# Patient Record
Sex: Male | Born: 2000 | Race: White | Hispanic: No | Marital: Single | Smoking: Never smoker
Health system: Southern US, Community
[De-identification: ages and names within clinical notes are randomized; demographics above are authoritative.]

## PROBLEM LIST (undated history)

## (undated) DIAGNOSIS — R569 Unspecified convulsions: Secondary | ICD-10-CM

## (undated) DIAGNOSIS — F909 Attention-deficit hyperactivity disorder, unspecified type: Secondary | ICD-10-CM

## (undated) HISTORY — DX: Attention-deficit hyperactivity disorder, unspecified type: F90.9

## (undated) HISTORY — PX: CIRCUMCISION: SUR203

---
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
## 2019-04-10 ENCOUNTER — Emergency Department (HOSPITAL_COMMUNITY)
Admission: EM | Admit: 2019-04-10 | Discharge: 2019-04-10 | Disposition: A | Discharge status: Home / Self Care | Payer: Medicaid Other | Attending: Emergency Medicine | Admitting: Emergency Medicine

## 2019-04-10 ENCOUNTER — Emergency Department (HOSPITAL_COMMUNITY): Payer: Medicaid Other

## 2019-04-10 ENCOUNTER — Encounter (HOSPITAL_COMMUNITY): Payer: Self-pay | Admitting: Emergency Medicine

## 2019-04-10 ENCOUNTER — Other Ambulatory Visit: Payer: Self-pay

## 2019-04-10 DIAGNOSIS — Z7722 Contact with and (suspected) exposure to environmental tobacco smoke (acute) (chronic): Secondary | ICD-10-CM | POA: Diagnosis not present

## 2019-04-10 DIAGNOSIS — Y999 Unspecified external cause status: Secondary | ICD-10-CM | POA: Insufficient documentation

## 2019-04-10 DIAGNOSIS — S161XXA Strain of muscle, fascia and tendon at neck level, initial encounter: Secondary | ICD-10-CM | POA: Diagnosis not present

## 2019-04-10 DIAGNOSIS — Y939 Activity, unspecified: Secondary | ICD-10-CM | POA: Diagnosis not present

## 2019-04-10 DIAGNOSIS — Y929 Unspecified place or not applicable: Secondary | ICD-10-CM | POA: Diagnosis not present

## 2019-04-10 DIAGNOSIS — M542 Cervicalgia: Secondary | ICD-10-CM | POA: Diagnosis not present

## 2019-04-10 DIAGNOSIS — R519 Headache, unspecified: Secondary | ICD-10-CM | POA: Diagnosis not present

## 2019-04-10 MED ORDER — CYCLOBENZAPRINE HCL 10 MG PO TABS: 10.0000 mg | ORAL_TABLET | Freq: Two times a day (BID) | ORAL | 0 refills | Status: DC | PRN

## 2019-04-10 NOTE — Discharge Instructions (Signed)
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
## 2019-04-10 NOTE — ED Notes (Signed)
Patient verbalizes understanding of discharge instructions. Opportunity for questioning and answers were provided. Armband removed by staff, pt discharged from ED.  
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
## 2023-05-04 ENCOUNTER — Other Ambulatory Visit: Payer: Self-pay

## 2023-05-04 ENCOUNTER — Emergency Department (HOSPITAL_COMMUNITY): Payer: BC Managed Care – PPO

## 2023-05-04 ENCOUNTER — Emergency Department (HOSPITAL_COMMUNITY)
Admission: EM | Admit: 2023-05-04 | Discharge: 2023-05-05 | Disposition: A | Discharge status: Home / Self Care | Payer: BC Managed Care – PPO | Source: Home / Self Care | Attending: Emergency Medicine | Admitting: Emergency Medicine

## 2023-05-04 ENCOUNTER — Emergency Department (HOSPITAL_COMMUNITY)
Admission: EM | Admit: 2023-05-04 | Discharge: 2023-05-04 | Disposition: A | Discharge status: Home / Self Care | Payer: BC Managed Care – PPO | Attending: Emergency Medicine | Admitting: Emergency Medicine

## 2023-05-04 ENCOUNTER — Encounter (HOSPITAL_COMMUNITY): Payer: Self-pay

## 2023-05-04 DIAGNOSIS — R1032 Left lower quadrant pain: Secondary | ICD-10-CM | POA: Insufficient documentation

## 2023-05-04 DIAGNOSIS — R319 Hematuria, unspecified: Secondary | ICD-10-CM | POA: Insufficient documentation

## 2023-05-04 DIAGNOSIS — R300 Dysuria: Secondary | ICD-10-CM | POA: Insufficient documentation

## 2023-05-04 DIAGNOSIS — R109 Unspecified abdominal pain: Secondary | ICD-10-CM | POA: Diagnosis present

## 2023-05-04 LAB — URINALYSIS, ROUTINE W REFLEX MICROSCOPIC
Bacteria, UA: NONE SEEN
Bilirubin Urine: NEGATIVE
Bilirubin Urine: NEGATIVE
Glucose, UA: NEGATIVE mg/dL
Glucose, UA: NEGATIVE mg/dL
Ketones, ur: NEGATIVE mg/dL
Ketones, ur: NEGATIVE mg/dL
Leukocytes,Ua: NEGATIVE
Leukocytes,Ua: NEGATIVE
Nitrite: NEGATIVE
Nitrite: NEGATIVE
Protein, ur: NEGATIVE mg/dL
Protein, ur: NEGATIVE mg/dL
RBC / HPF: >50 RBC/hpf (ref 0–5)
Specific Gravity, Urine: 1.023 (ref 1.005–1.030)
Specific Gravity, Urine: 1.02 (ref 1.005–1.030)
pH: 5 (ref 5.0–8.0)
pH: 6 (ref 5.0–8.0)

## 2023-05-04 LAB — BASIC METABOLIC PANEL
Anion gap: 13 (ref 5–15)
Anion gap: 9 (ref 5–15)
BUN: 10 mg/dL (ref 6–20)
BUN: 11 mg/dL (ref 6–20)
CO2: 25 mmol/L (ref 22–32)
CO2: 24 mmol/L (ref 22–32)
Calcium: 9.6 mg/dL (ref 8.9–10.3)
Calcium: 9 mg/dL (ref 8.9–10.3)
Chloride: 103 mmol/L (ref 98–111)
Chloride: 105 mmol/L (ref 98–111)
Creatinine, Ser: 1.15 mg/dL (ref 0.61–1.24)
Creatinine, Ser: 1.35 mg/dL — ABNORMAL HIGH (ref 0.61–1.24)
GFR, Estimated: >60 mL/min (ref >60)
GFR, Estimated: >60 mL/min (ref >60)
Glucose, Bld: 111 mg/dL — ABNORMAL HIGH (ref 70–99)
Glucose, Bld: 111 mg/dL — ABNORMAL HIGH (ref 70–99)
Potassium: 3.7 mmol/L (ref 3.5–5.1)
Potassium: 3.6 mmol/L (ref 3.5–5.1)
Sodium: 141 mmol/L (ref 135–145)
Sodium: 138 mmol/L (ref 135–145)

## 2023-05-04 LAB — CBC
HCT: 41.8 % (ref 39.0–52.0)
Hemoglobin: 14.4 g/dL (ref 13.0–17.0)
MCH: 29.4 pg (ref 26.0–34.0)
MCHC: 34.4 g/dL (ref 30.0–36.0)
MCV: 85.3 fL (ref 80.0–100.0)
Platelets: 264 10*3/uL (ref 150–400)
RBC: 4.9 MIL/uL (ref 4.22–5.81)
RDW: 12.4 % (ref 11.5–15.5)
WBC: 11 10*3/uL — ABNORMAL HIGH (ref 4.0–10.5)
nRBC: 0 % (ref 0.0–0.2)

## 2023-05-04 LAB — CBC WITH DIFFERENTIAL/PLATELET
Abs Immature Granulocytes: 0.26 10*3/uL — ABNORMAL HIGH (ref 0.00–0.07)
Basophils Absolute: 0.1 10*3/uL (ref 0.0–0.1)
Basophils Relative: 1 %
Eosinophils Absolute: 0.1 10*3/uL (ref 0.0–0.5)
Eosinophils Relative: 1 %
HCT: 40.7 % (ref 39.0–52.0)
Hemoglobin: 14 g/dL (ref 13.0–17.0)
Immature Granulocytes: 3 %
Lymphocytes Relative: 14 %
Lymphs Abs: 1.4 10*3/uL (ref 0.7–4.0)
MCH: 28.9 pg (ref 26.0–34.0)
MCHC: 34.4 g/dL (ref 30.0–36.0)
MCV: 84.1 fL (ref 80.0–100.0)
Monocytes Absolute: 0.6 10*3/uL (ref 0.1–1.0)
Monocytes Relative: 5 %
Neutro Abs: 8.2 10*3/uL — ABNORMAL HIGH (ref 1.7–7.7)
Neutrophils Relative %: 76 %
Platelets: 279 10*3/uL (ref 150–400)
RBC: 4.84 MIL/uL (ref 4.22–5.81)
RDW: 12.3 % (ref 11.5–15.5)
WBC: 10.6 10*3/uL — ABNORMAL HIGH (ref 4.0–10.5)
nRBC: 0 % (ref 0.0–0.2)

## 2023-05-04 MED ORDER — NAPROXEN 375 MG PO TABS: 375.0000 mg | ORAL_TABLET | Freq: Two times a day (BID) | ORAL | 0 refills | Status: DC

## 2023-05-04 MED ORDER — OXYCODONE-ACETAMINOPHEN 5-325 MG PO TABS
1.0000 | ORAL_TABLET | ORAL | Status: DC | PRN
  Administered 2023-05-04: 1 via ORAL
  Filled 2023-05-04: qty 1

## 2023-05-04 MED ORDER — ONDANSETRON 4 MG PO TBDP
4.0000 mg | ORAL_TABLET | Freq: Once | ORAL | Status: AC
  Administered 2023-05-04: 4 mg via ORAL
  Filled 2023-05-04: qty 1

## 2023-05-04 NOTE — Discharge Instructions (Addendum)
 Thank you for letting us  evaluate you today.  Your CT kidney stone study was negative for any acute stone.  However, there is some mild fluid in the kidney that may have resulted from a previous stone.  Your urine also showed blood in it which could have been likely due to a previous stone.  We have sent off a urine culture and we will call you if your urine grows bacteria in order to put you on an antibiotic.  I have also sent you naproxen  which is a strong ibuprofen  if you need it for pain.  If you take naproxen , do not use ibuprofen , Aleve , aspirin with it.  Return to emergency department if you experience significant worsening of pain, burning when you urinate, difficulty urinating, urinary retention.  Otherwise, you can follow-up with the PCP recommended above if symptoms do not improve within the next week

## 2023-05-04 NOTE — ED Triage Notes (Signed)
 Pt reports left flank pain that started today around 0300. He was seen here this morning 0600 and told there was no kidney stone or perhaps he had already passed it, but his symptoms have returned and states he is having trouble urinating. Last void was 2 hrs ago. The prescribed Naproxen  is not working.

## 2023-05-04 NOTE — ED Notes (Signed)
Heat pack provided.  

## 2023-05-04 NOTE — ED Triage Notes (Signed)
 Reports left flank pain that radiates around to left lower abd and pelvis.  Denies injury.  Denies urinary symptoms.  Reports its worse when he walks.  No n/v/d.

## 2023-05-04 NOTE — ED Provider Notes (Signed)
 [place] EMERGENCY DEPARTMENT AT [facility] Provider Note   CSN: [id number] Arrival date & time: 05/04/23  0505     History  Chief Complaint  Patient presents with  . Flank Pain    Richard Ryan is a 23 y.o. male past medical history of seizure-like activity, ADHD presents to emergency department for evaluation of sharp left-sided flank pain that started at 0300 this morning waking him from sleep. Reports pain is worsened with walking. He denies urinary symptoms, history of nephrolithiasis, fevers.  Of note, he reports he had a stomach bug last week and had nausea, vomiting, diarrhea.  However, this has mostly resolved at this time.   Flank Pain Pertinent negatives include no chest pain, no abdominal pain, no headaches and no shortness of breath.     Home Medications Prior to Admission medications   Medication Sig Start Date End Date Taking? Authorizing Provider  ibuprofen  (ADVIL ) 200 MG tablet Take 800 mg by mouth 2 (two) times daily as needed for moderate pain (pain score 4-6).   Yes [provider]  naproxen  (NAPROSYN ) 375 MG tablet Take 1 tablet (375 mg total) by mouth 2 (two) times daily. 05/04/23  Yes Minnie Tinnie BRAVO, PA      Allergies    Food    Review of Systems   Review of Systems  Constitutional:  Negative for chills, fatigue and fever.  Respiratory:  Negative for cough, chest tightness, shortness of breath and wheezing.   Cardiovascular:  Negative for chest pain and palpitations.  Gastrointestinal:  Negative for abdominal pain, constipation, diarrhea, nausea and vomiting.  Genitourinary:  Positive for flank pain. Negative for decreased urine volume, difficulty urinating, dysuria, frequency, penile discharge, penile pain and urgency.  Neurological:  Negative for dizziness, seizures, weakness, light-headedness, numbness and headaches.    Physical Exam Updated Vital Signs BP 130/77 (BP Location: Right Arm)   Pulse 64   Temp 97.9 F (36.6  C) (Oral)   Resp 18   Ht 5' 10 (1.778 m)   Wt 99.8 kg   SpO2 98%   BMI 31.57 kg/m  Physical Exam Vitals and nursing note reviewed.  Constitutional:      General: He is not in acute distress.    Appearance: Normal appearance.  HENT:     Head: Normocephalic and atraumatic.  Eyes:     Conjunctiva/sclera: Conjunctivae normal.  Cardiovascular:     Rate and Rhythm: Normal rate.  Pulmonary:     Effort: Pulmonary effort is normal. No respiratory distress.  Abdominal:     General: Bowel sounds are normal. There is no distension.     Palpations: Abdomen is soft.     Tenderness: There is no abdominal tenderness. There is no right CVA tenderness, left CVA tenderness or rebound.     Comments: No peritoneal signs  Musculoskeletal:       Arms:     Right lower leg: No edema.     Left lower leg: No edema.  Skin:    General: Skin is warm.     Coloration: Skin is not jaundiced or pale.  Neurological:     Mental Status: He is alert and oriented to person, place, and time. Mental status is at baseline.    ED Results / Procedures / Treatments   Labs (all labs ordered are listed, but only abnormal results are displayed) Labs Reviewed  URINALYSIS, ROUTINE W REFLEX MICROSCOPIC - Abnormal; Notable for the following components:      Result  Value   Hgb urine dipstick MODERATE (*)    All other components within normal limits  BASIC METABOLIC PANEL - Abnormal; Notable for the following components:   Glucose, Bld 111 (*)    All other components within normal limits  CBC WITH DIFFERENTIAL/PLATELET - Abnormal; Notable for the following components:   WBC 10.6 (*)    Neutro Abs 8.2 (*)    Abs Immature Granulocytes 0.26 (*)    All other components within normal limits  URINE CULTURE    EKG None  Radiology CT Renal Stone Study Result Date: 05/04/2023 CLINICAL DATA:  23 year old male with history of left-sided flank pain and abdominal pain. Suspected stone. EXAM: CT ABDOMEN AND PELVIS WITHOUT  CONTRAST TECHNIQUE: Multidetector CT imaging of the abdomen and pelvis was performed following the standard protocol without IV contrast. RADIATION DOSE REDUCTION: This exam was performed according to the departmental dose-optimization program which includes automated exposure control, adjustment of the mA and/or kV according to patient size and/or use of iterative reconstruction technique. COMPARISON:  No priors. FINDINGS: Lower chest: Scattered areas of scarring in the lung bases bilaterally. Elevation of the right hemidiaphragm. Hepatobiliary: No suspicious cystic or solid hepatic lesions are confidently identified on today's noncontrast CT examination. Unenhanced appearance of the gallbladder is unremarkable. Pancreas: No definite pancreatic mass or peripancreatic fluid collections or inflammatory changes are noted on today's noncontrast CT examination. Spleen: Unremarkable. Adrenals/Urinary Tract: No calcifications are identified within the collecting system of either kidney, along the course of either ureter, or within the lumen of the urinary bladder. Mild left-sided hydroureteronephrosis and perinephric soft tissue stranding. Right kidney and bilateral adrenal glands are normal in appearance. No right hydroureteronephrosis. Urinary bladder is nearly completely decompressed, but otherwise unremarkable in appearance. Stomach/Bowel: The unenhanced appearance of the stomach is normal. No pathologic dilatation of small bowel or colon. Normal appendix. Vascular/Lymphatic: No atherosclerotic calcifications are noted in the abdominal aorta or pelvic vasculature. Retroaortic left renal vein (normal anatomical variant) incidentally noted. No lymphadenopathy noted in the abdomen or pelvis. Reproductive: Prostate gland and seminal vesicles are unremarkable in appearance. Other: No significant volume of ascites.  No pneumoperitoneum. Musculoskeletal: There are no aggressive appearing lytic or blastic lesions noted in the  visualized portions of the skeleton. IMPRESSION: 1. Mild left-sided hydroureteronephrosis and perinephric soft tissue stranding. No obstructive calculus identified. Findings are nonspecific and could reflect left-sided pyelonephritis. Correlation with urinalysis is recommended. Electronically Signed   By: Toribio Aye M.D.   On: 05/04/2023 06:53    Procedures Procedures    Medications Ordered in ED Medications  oxyCODONE -acetaminophen  (PERCOCET/ROXICET) 5-325 MG per tablet 1 tablet (1 tablet Oral Given 05/04/23 0550)  ondansetron  (ZOFRAN -ODT) disintegrating tablet 4 mg (4 mg Oral Given 05/04/23 0552)    ED Course/ Medical Decision Making/ A&P Clinical Course as of 05/04/23 1210  Fri May 04, 2023  1159 WBC(!): 10.6 Most likely due to recent gastroenteritis [LB]  1200 Hgb urine dipstickROLLEN): MODERATE Hgb without bacteria.  Will send culture to ensure no UTI [LB]  1209 Urine Culture Called lab to ensure there was enough urine from UA to obtain culture. Lab reports there is adequate urine for culture [LB]    Clinical Course User Index [LB] Minnie Tinnie BRAVO, PA                                 Medical Decision Making Amount and/or Complexity of Data Reviewed Labs: ordered.  Patient presents to the ED for concern of left flank pain, this involves an extensive number of treatment options, and is a complaint that carries with it a high risk of complications and morbidity.  The differential diagnosis includes nephrolithiasis, pyelo-, UTI, MSK.  This is not an exhaustive list   Co morbidities that complicate the patient evaluation  None   Additional history obtained:  Additional history obtained from Nursing and Outside Medical Records   External records from outside source obtained and reviewed including  Note Recent medical evaluation and medication list   Lab Tests:  I Ordered, and personally interpreted labs.  The pertinent results include:   Mild leukocytosis at 10.6 UA  significant for moderate hemoglobin.  No bacteria, leukocytes, nitrates, nor WBC to indicate infection   Imaging Studies ordered:  I ordered imaging studies including CT renal stone study I independently visualized and interpreted imaging which showed   Mild left-sided hydroureteronephrosis and perinephric soft tissue stranding. No obstructive calculus identified I agree with the radiologist interpretation    Medicines ordered and prescription drug management:  I ordered medication including percocet,zofran , naprosyn   for pain/antiinflammatory  Reevaluation of the patient after these medicines showed that the patient improved I have reviewed the patients home medicines and have made adjustments as needed    Problem List / ED Course:  Left flank pain Hematuria Provided Percocet and Zofran  Renal study is negative for nephrolithiasis at this time.  There is mild left-sided hydroureteronephrosis and perinephric small tissue stranding. Ways negative for infection but significant for Hgb -although a stone cannot be visualized on CT it may have passed and would be significant with symptoms and presentation Will send culture to ensure no infection   Reevaluation:  After the interventions noted above, I reevaluated the patient and found that they have :improved  Pain improved from sharp 10 out of 10 to 3 out of 10 while in ED   Social Determinants of Health:  Will provide PCP follow-up as he does not have 1   Dispostion:  After consideration of the diagnostic results and the patients response to treatment, I feel that the patent would benefit from patient management and follow-up with PCP if symptoms do not improve over the next week.   Discussed patient, ED workup with Dr. Mannie who agrees with plan  Final Clinical Impression(s) / ED Diagnoses Final diagnoses:  Left flank pain  Hematuria, unspecified type    Rx / DC Orders ED Discharge Orders          Ordered     naproxen  (NAPROSYN ) 375 MG tablet  2 times daily        05/04/23 1202              Minnie Tinnie BRAVO, PA 05/04/23 1204    Mannie Pac T, DO 05/05/23 1123

## 2023-05-05 LAB — URINE CULTURE: Culture: NO GROWTH

## 2023-05-05 MED ORDER — HYDROCODONE-ACETAMINOPHEN 5-325 MG PO TABS: 1.0000 | ORAL_TABLET | Freq: Four times a day (QID) | ORAL | 0 refills | Status: DC | PRN

## 2023-05-05 MED ORDER — CEPHALEXIN 500 MG PO CAPS: 500.0000 mg | ORAL_CAPSULE | Freq: Four times a day (QID) | ORAL | 0 refills | Status: DC

## 2023-05-05 NOTE — ED Provider Notes (Signed)
 [place] EM[facility]GENCY DE[facility] AT  HOSPITAL Provider Note   CSN: [id number] Arrival date & time: 05/04/23  1712     History  Chief Complaint  Patient presents with   Flank Pain    Richard Ryan is a 23 y.o. male.  The history is provided by the patient.  Patient presents for continued left flank and abdominal pain.  This started about 24 hours ago. Patient was seen in the ER yesterday had a CT scan and told it was normal was discharged home Since that time his pain is worsening.  He also reports some difficulty urinating. He is unsure if he had a fever, but no vomiting.  No chest pain or shortness of breath.  He has never had a kidney stone before     Home Medications Prior to Admission medications   Medication Sig Start Date End Date Taking? Authorizing Provider  cephALEXin  (KEFLEX ) 500 MG capsule Take 1 capsule (500 mg total) by mouth 4 (four) times daily. 05/05/23  Yes Midge Golas, MD  HYDROcodone -acetaminophen  (NORCO/VICODIN) 5-325 MG tablet Take 1 tablet by mouth every 6 (six) hours as needed for severe pain (pain score 7-10). 05/05/23  Yes Midge Golas, MD  ibuprofen  (ADVIL ) 200 MG tablet Take 800 mg by mouth 2 (two) times daily as needed for moderate pain (pain score 4-6).    [provider]  naproxen  (NAPROSYN ) 375 MG tablet Take 1 tablet (375 mg total) by mouth 2 (two) times daily. 05/04/23   Minnie Tinnie BRAVO, PA      Allergies    Food    Review of Systems   Review of Systems  Constitutional:  Negative for fever.  Gastrointestinal:  Negative for vomiting.  Genitourinary:  Positive for difficulty urinating and flank pain.    Physical Exam Updated Vital Signs BP 127/74   Pulse (!) 59   Temp 98.5 F (36.9 C) (Oral)   Resp 17   Ht 1.778 m (5' 10)   Wt 99.8 kg   SpO2 96%   BMI 31.57 kg/m  Physical Exam CONSTITUTIONAL: Well developed/well nourished HEAD: Normocephalic/atraumatic EYES: EOMI ENMT: Mucous membranes moist NECK:  supple no meningeal signs CV: S1/S2 noted, no murmurs/rubs/gallops noted LUNGS: Lungs are clear to auscultation bilaterally, no apparent distress ABDOMEN: soft, mild LLQ tenderness, no rebound or guarding, bowel sounds noted throughout abdomen GU:no cva tenderness NEURO: Pt is awake/alert/appropriate, moves all extremitiesx4.  No facial droop.   EXTREMITIES: pulses normal/equal, full ROM SKIN: warm, color normal PSYCH: Flat affect  ED Results / Procedures / Treatments   Labs (all labs ordered are listed, but only abnormal results are displayed) Labs Reviewed  URINALYSIS, ROUTINE W REFLEX MICROSCOPIC - Abnormal; Notable for the following components:      Result Value   Hgb urine dipstick MODERATE (*)    Bacteria, UA RARE (*)    All other components within normal limits  BASIC METABOLIC PANEL - Abnormal; Notable for the following components:   Glucose, Bld 111 (*)    Creatinine, Ser 1.35 (*)    All other components within normal limits  CBC - Abnormal; Notable for the following components:   WBC 11.0 (*)    All other components within normal limits    EKG None  Radiology CT Renal Stone Study Result Date: 05/04/2023 CLINICAL DATA:  23 year old male with history of left-sided flank pain and abdominal pain. Suspected stone. EXAM: CT ABDOMEN AND PELVIS WITHOUT CONTRAST TECHNIQUE: Multidetector CT imaging of the abdomen and pelvis  was performed following the standard protocol without IV contrast. RADIATION DOSE REDUCTION: This exam was performed according to the departmental dose-optimization program which includes automated exposure control, adjustment of the mA and/or kV according to patient size and/or use of iterative reconstruction technique. COMPARISON:  No priors. FINDINGS: Lower chest: Scattered areas of scarring in the lung bases bilaterally. Elevation of the right hemidiaphragm. Hepatobiliary: No suspicious cystic or solid hepatic lesions are confidently identified on today's  noncontrast CT examination. Unenhanced appearance of the gallbladder is unremarkable. Pancreas: No definite pancreatic mass or peripancreatic fluid collections or inflammatory changes are noted on today's noncontrast CT examination. Spleen: Unremarkable. Adrenals/Urinary Tract: No calcifications are identified within the collecting system of either kidney, along the course of either ureter, or within the lumen of the urinary bladder. Mild left-sided hydroureteronephrosis and perinephric soft tissue stranding. Right kidney and bilateral adrenal glands are normal in appearance. No right hydroureteronephrosis. Urinary bladder is nearly completely decompressed, but otherwise unremarkable in appearance. Stomach/Bowel: The unenhanced appearance of the stomach is normal. No pathologic dilatation of small bowel or colon. Normal appendix. Vascular/Lymphatic: No atherosclerotic calcifications are noted in the abdominal aorta or pelvic vasculature. Retroaortic left renal vein (normal anatomical variant) incidentally noted. No lymphadenopathy noted in the abdomen or pelvis. Reproductive: Prostate gland and seminal vesicles are unremarkable in appearance. Other: No significant volume of ascites.  No pneumoperitoneum. Musculoskeletal: There are no aggressive appearing lytic or blastic lesions noted in the visualized portions of the skeleton. IMPRESSION: 1. Mild left-sided hydroureteronephrosis and perinephric soft tissue stranding. No obstructive calculus identified. Findings are nonspecific and could reflect left-sided pyelonephritis. Correlation with urinalysis is recommended. Electronically Signed   By: Toribio Aye M.D.   On: 05/04/2023 06:53    Procedures Procedures    Medications Ordered in ED Medications - No data to display  ED Course/ Medical Decision Making/ A&P                                 Medical Decision Making Amount and/or Complexity of Data Reviewed Labs: ordered.  Risk Prescription drug  management.   This patient presents to the ED for concern of flank pain, this involves an extensive number of treatment options, and is a complaint that carries with it a high risk of complications and morbidity.  The differential diagnosis includes but is not limited to pyelonephritis, muscle strain, ureteral stone   Social Determinants of Health: Patient's  passive smoke history   increases the complexity of managing their presentation  Additional history obtained: Records reviewed  previous labs reviewed  Lab Tests: I Ordered, and personally interpreted labs.  The pertinent results include: Hematuria, mild renal insufficiency  Complexity of problems addressed: Patient's presentation is most consistent with  acute complicated illness/injury requiring diagnostic workup  Disposition: After consideration of the diagnostic results and the patient's response to treatment,  I feel that the patent would benefit from discharge   .    Patient has had CT imaging that revealed possible pyelo versus recently passed stone Reports continued pain.  He is in no acute distress, not septic appearing.  Will add on antibiotics and short course of pain medicine. Given his clinical appearance I do not feel he needs repeat imaging.  Will refer to urology       Final Clinical Impression(s) / ED Diagnoses Final diagnoses:  Left flank pain    Rx / DC Orders ED Discharge Orders  Ordered    cephALEXin  (KEFLEX ) 500 MG capsule  4 times daily        05/05/23 0318    HYDROcodone -acetaminophen  (NORCO/VICODIN) 5-325 MG tablet  Every 6 hours PRN        05/05/23 0318              Midge Golas, MD 05/05/23 [phone]

## 2023-08-09 ENCOUNTER — Ambulatory Visit (INDEPENDENT_AMBULATORY_CARE_PROVIDER_SITE_OTHER): Admitting: Physician Assistant

## 2023-08-09 ENCOUNTER — Encounter: Payer: Self-pay | Admitting: Physician Assistant

## 2023-08-09 ENCOUNTER — Ambulatory Visit (HOSPITAL_BASED_OUTPATIENT_CLINIC_OR_DEPARTMENT_OTHER)
Admission: RE | Admit: 2023-08-09 | Discharge: 2023-08-09 | Disposition: A | Discharge status: Home / Self Care | Source: Ambulatory Visit | Attending: Physician Assistant | Admitting: Physician Assistant

## 2023-08-09 VITALS — BP 120/73 | HR 85 | Ht 69.0 in | Wt 275.6 lb

## 2023-08-09 DIAGNOSIS — Z79899 Other long term (current) drug therapy: Secondary | ICD-10-CM | POA: Diagnosis not present

## 2023-08-09 DIAGNOSIS — R739 Hyperglycemia, unspecified: Secondary | ICD-10-CM | POA: Diagnosis not present

## 2023-08-09 DIAGNOSIS — Z23 Encounter for immunization: Secondary | ICD-10-CM | POA: Diagnosis not present

## 2023-08-09 DIAGNOSIS — R200 Anesthesia of skin: Secondary | ICD-10-CM | POA: Insufficient documentation

## 2023-08-09 DIAGNOSIS — F902 Attention-deficit hyperactivity disorder, combined type: Secondary | ICD-10-CM | POA: Diagnosis not present

## 2023-08-09 NOTE — Progress Notes (Signed)
 New patient visit   Patient: Richard Ryan   DOB: [birth year]   23 y.o. Male  MRN: [id number] Visit Date: 08/09/2023  Today's healthcare provider: Alfredia Ferguson, PA-C   Cc.  New patient, foot numbness, Adderall  Subjective    Richard Ryan is a 23 y.o. male who presents today as a new patient to establish care.   Discussed the use of AI scribe software for clinical note transcription with the patient, who gave verbal consent to proceed.  History of Present Illness   The patient, with a history of ADD, presents with numbness in the feet that has been occurring for approximately three years. The numbness is noted to be worse than when it first started. The numbness is most prominent first thing in the morning and after being on the feet for a long time. The sensation is described as similar to when the foot is asleep, with pins and needles sensation. It takes about ten minutes of walking around for the sensation to return. The patient denies any major injuries in the past three years, except for a kidney stone. 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 The patient denies any numbness or tingling in the hands.   In addition, the patient reports difficulty with ADD symptoms affecting work and driving. The patient was previously on 5mg  of ADD medication, which was somewhat effective. The patient is seeking to restart this medication. The patient works night shifts, with a schedule that changes every six weeks. The patient denies any issues with falling asleep but reports difficulty waking up.        Past Medical History:  Diagnosis Date   Seizures (HCC) 08/18/2014   Past Surgical History:  Procedure Laterality Date   CIRCUMCISION  June 24, 1999   Family Status  Relation Name Status   Mother Amy Alive   Father Reita Cliche Alive   Sister Immunologist Alive       Older half sister   Brother Optometrist Alive        Older   MGM  Alive   MGF  Deceased   PGM  Alive   PGF  Deceased   Brother [place] Alive       Younger  No partnership data on file   Family History  Problem Relation Age of Onset   Arthritis Mother    Asthma Mother    COPD Mother    Alcohol abuse Father    Drug abuse Father    Intellectual disability Sister    Learning disabilities Sister    Obesity Sister    ADD / ADHD Brother    Intellectual disability Brother    Learning disabilities Brother    Heart attack Maternal Grandfather        Died at 19   Heart attack Paternal Grandfather        Died at 82   ADD / ADHD Brother    Social History   Socioeconomic History   Marital status: Single    Spouse name: Not on file   Number of children: Not on file   Years of education: Not on file   Highest education level: 12th grade  Occupational History   Not on file  Tobacco Use   Smoking status: Never    Passive exposure: Yes   Smokeless tobacco: Never   Tobacco comments:    Mom smokes electronic cigaretts only  Vaping Use  Vaping status: Former  Substance and Sexual Activity   Alcohol use: Not Currently    Alcohol/week: 1.0 standard drink of alcohol    Types: 1 Standard drinks or equivalent per week   Drug use: No   Sexual activity: Yes    Birth control/protection: Condom, Pill  Other Topics Concern   Not on file  Social History Narrative   Not on file   Social Drivers of Health   Financial Resource Strain: Medium Risk (08/02/2023)   Overall Financial Resource Strain (CARDIA)    Difficulty of Paying Living Expenses: Somewhat hard  Food Insecurity: Food Insecurity Present (08/02/2023)   Hunger Vital Sign    Worried About Running Out of Food in the Last Year: Sometimes true    Ran Out of Food in the Last Year: Sometimes true  Transportation Needs: Unmet Transportation Needs (08/02/2023)   PRAPARE - Transportation    Lack of Transportation (Medical): No    Lack of Transportation (Non-Medical): Yes  Physical  Activity: Sufficiently Active (08/02/2023)   Exercise Vital Sign    Days of Exercise per Week: 6 days    Minutes of Exercise per Session: 40 min  Stress: Stress Concern Present (08/02/2023)   [employer] of Occupational Health - Occupational Stress Questionnaire    Feeling of Stress : Very much  Social Connections: Unknown (08/02/2023)   Social Connection and Isolation Panel [NHANES]    Frequency of Communication with Friends and Family: Never    Frequency of Social Gatherings with Friends and Family: Once a week    Attends Religious Services: Never    Database administrator or Organizations: No    Attends Engineer, structural: Not on file    Marital Status: Patient declined   Outpatient Medications Prior to Visit  Medication Sig   [DISCONTINUED] cephALEXin (KEFLEX) 500 MG capsule Take 1 capsule (500 mg total) by mouth 4 (four) times daily.   [DISCONTINUED] HYDROcodone-acetaminophen (NORCO/VICODIN) 5-325 MG tablet Take 1 tablet by mouth every 6 (six) hours as needed for severe pain (pain score 7-10).   [DISCONTINUED] ibuprofen (ADVIL) 200 MG tablet Take 800 mg by mouth 2 (two) times daily as needed for moderate pain (pain score 4-6).   [DISCONTINUED] naproxen (NAPROSYN) 375 MG tablet Take 1 tablet (375 mg total) by mouth 2 (two) times daily.   No facility-administered medications prior to visit.   Allergies  Allergen Reactions   Food Other (See Comments)    Unknown ingredient in alfredo sauce causes diaphoresis     Immunization History  Administered Date(s) Administered   H1N1 03/02/2008   HPV Quadrivalent 12/09/2010, 02/07/2012, 07/07/2013   Hepatitis A 12/03/2007   Influenza Split 02/07/2012   Influenza Whole 03/20/2007   Influenza,inj,Quad PF,6+ Mos 07/07/2013   Meningococcal Conjugate 12/21/2015   Meningococcal polysaccharide vaccine (MPSV4) 12/09/2010   Tdap 12/09/2010, 08/09/2023   Varicella 12/03/2007    Health Maintenance  Topic Date Due   COVID-19  Vaccine (1) Never done   HIV Screening  Never done   Hepatitis C Screening  Never done   INFLUENZA VACCINE  11/30/2023   DTaP/Tdap/Td (3 - Td or Tdap) 08/08/2033   HPV VACCINES  Completed   Meningococcal B Vaccine  Aged Out    Patient Care Team: Alfredia Ferguson, PA-C as PCP - General (Physician Assistant)  Review of Systems  Constitutional:  Negative for fatigue and fever.  Respiratory:  Negative for cough and shortness of breath.   Cardiovascular:  Negative for chest pain, palpitations and  leg swelling.  Neurological:  Positive for numbness. Negative for dizziness and headaches.        Objective    BP 120/73   Pulse 85   Ht 5\' 9"  (1.753 m)   Wt 275 lb 9.6 oz (125 kg)   BMI 40.70 kg/m     Physical Exam Constitutional:      General: He is awake.     Appearance: He is well-developed.  HENT:     Head: Normocephalic.  Eyes:     Conjunctiva/sclera: Conjunctivae normal.  Cardiovascular:     Rate and Rhythm: Normal rate and regular rhythm.     Pulses:          Dorsalis pedis pulses are 3+ on the right side and 3+ on the left side.       Posterior tibial pulses are 3+ on the right side and 3+ on the left side.     Heart sounds: Normal heart sounds.  Pulmonary:     Effort: Pulmonary effort is normal.     Breath sounds: Normal breath sounds.  Feet:     Right foot:     Protective Sensation: 7 sites tested.  5 sites sensed.     Skin integrity: Skin integrity normal.     Toenail Condition: Right toenails are normal.     Left foot:     Protective Sensation: 7 sites tested.  7 sites sensed.     Skin integrity: Skin integrity normal.     Toenail Condition: Left toenails are normal.     Comments: Right ankle patient does not have any sensation.  Otherwise right foot sensation is intact.  The left ankle and foot sensation is completely intact. Skin:    General: Skin is warm.  Neurological:     Mental Status: He is alert and oriented to person, place, and time.   Psychiatric:        Attention and Perception: Attention normal.        Mood and Affect: Mood normal.        Speech: Speech normal.        Behavior: Behavior is cooperative.     Depression Screen    08/01/2017    3:49 PM 12/21/2015    4:25 PM  PHQ 2/9 Scores  PHQ - 2 Score 0 0   No results found for any visits on 08/09/23.  Assessment & Plan     Numbness of foot Assessment & Plan: By patient's history seems positional. Will start with labs including A1c and B12.  Ordering plain films of the lumbar spine.  Orders: -     CBC with Differential/Platelet -     Comprehensive metabolic panel with GFR -     Hemoglobin A1c -     Vitamin B12 -     DG Lumbar Spine Complete; Future  Attention deficit hyperactivity disorder (ADHD), combined type Assessment & Plan: Historically took Adderall 5 mg once daily.  This may not be adequate for him, once UDS is complete and appropriate, admits to as needed marijuana use, will prescribe Adderall 5 mg IR twice daily    Orders: -     Drug Monitoring Panel [phone] , Urine  Controlled substance agreement signed -     Drug Monitoring Panel [phone] , Urine  Hyperglycemia -     Hemoglobin A1c  Need for Tdap vaccination -     Tdap vaccine greater than or equal to 7yo IM   Return in about 6 weeks (  around 09/20/2023), or Pending labs, for adhd.      Alfredia Ferguson, PA-C  [facility] at [facility] [phone] (phone) [phone] (fax)  [facility]

## 2023-08-09 NOTE — Assessment & Plan Note (Signed)
 Historically took Adderall 5 mg once daily.  This may not be adequate for him, once UDS is complete and appropriate, admits to as needed marijuana use, will prescribe Adderall 5 mg IR twice daily

## 2023-08-09 NOTE — Assessment & Plan Note (Signed)
 By patient's history seems positional. Will start with labs including A1c and B12.  Ordering plain films of the lumbar spine.

## 2023-08-10 ENCOUNTER — Encounter: Payer: Self-pay | Admitting: Physician Assistant

## 2023-08-10 ENCOUNTER — Other Ambulatory Visit: Payer: Self-pay | Admitting: Physician Assistant

## 2023-08-10 DIAGNOSIS — M51369 Other intervertebral disc degeneration, lumbar region without mention of lumbar back pain or lower extremity pain: Secondary | ICD-10-CM

## 2023-08-10 DIAGNOSIS — R200 Anesthesia of skin: Secondary | ICD-10-CM

## 2023-08-10 LAB — CBC WITH DIFFERENTIAL/PLATELET
Basophils Absolute: 0 10*3/uL (ref 0.0–0.1)
Basophils Relative: 0.6 % (ref 0.0–3.0)
Eosinophils Absolute: 0.1 10*3/uL (ref 0.0–0.7)
Eosinophils Relative: 2 % (ref 0.0–5.0)
HCT: 45.8 % (ref 39.0–52.0)
Hemoglobin: 15.5 g/dL (ref 13.0–17.0)
Lymphocytes Relative: 20.6 % (ref 12.0–46.0)
Lymphs Abs: 1 10*3/uL (ref 0.7–4.0)
MCHC: 33.7 g/dL (ref 30.0–36.0)
MCV: 88.3 fl (ref 78.0–100.0)
Monocytes Absolute: 0.3 10*3/uL (ref 0.1–1.0)
Monocytes Relative: 6.9 % (ref 3.0–12.0)
Neutro Abs: 3.4 10*3/uL (ref 1.4–7.7)
Neutrophils Relative %: 69.9 % (ref 43.0–77.0)
Platelets: 242 10*3/uL (ref 150.0–400.0)
RBC: 5.19 Mil/uL (ref 4.22–5.81)
RDW: 13.1 % (ref 11.5–15.5)
WBC: 4.8 10*3/uL (ref 4.0–10.5)

## 2023-08-10 LAB — VITAMIN B12: Vitamin B-12: 318 pg/mL (ref 211–911)

## 2023-08-10 LAB — COMPREHENSIVE METABOLIC PANEL WITH GFR
ALT: 51 U/L (ref 0–53)
AST: 28 U/L (ref 0–37)
Albumin: 4.7 g/dL (ref 3.5–5.2)
Alkaline Phosphatase: 78 U/L (ref 39–117)
BUN: 10 mg/dL (ref 6–23)
CO2: 30 meq/L (ref 19–32)
Calcium: 9.3 mg/dL (ref 8.4–10.5)
Chloride: 101 meq/L (ref 96–112)
Creatinine, Ser: 0.93 mg/dL (ref 0.40–1.50)
GFR: 115.34 mL/min (ref >60.00)
Glucose, Bld: 100 mg/dL — ABNORMAL HIGH (ref 70–99)
Potassium: 4.2 meq/L (ref 3.5–5.1)
Sodium: 139 meq/L (ref 135–145)
Total Bilirubin: 0.4 mg/dL (ref 0.2–1.2)
Total Protein: 7.1 g/dL (ref 6.0–8.3)

## 2023-08-10 LAB — HEMOGLOBIN A1C: Hgb A1c MFr Bld: 5.7 % (ref 4.6–6.5)

## 2023-08-11 LAB — DRUG MONITORING PANEL 376104, URINE
Amphetamines: NEGATIVE ng/mL (ref <500)
Barbiturates: NEGATIVE ng/mL (ref <300)
Benzodiazepines: NEGATIVE ng/mL (ref <100)
Cocaine Metabolite: NEGATIVE ng/mL (ref <150)
Desmethyltramadol: NEGATIVE ng/mL (ref <100)
Opiates: NEGATIVE ng/mL (ref <100)
Oxycodone: NEGATIVE ng/mL (ref <100)
Tramadol: NEGATIVE ng/mL (ref <100)

## 2023-08-11 LAB — DM TEMPLATE

## 2023-08-14 ENCOUNTER — Other Ambulatory Visit: Payer: Self-pay | Admitting: Physician Assistant

## 2023-08-14 DIAGNOSIS — F900 Attention-deficit hyperactivity disorder, predominantly inattentive type: Secondary | ICD-10-CM

## 2023-08-14 MED ORDER — AMPHETAMINE-DEXTROAMPHETAMINE 5 MG PO TABS: 5.0000 mg | ORAL_TABLET | Freq: Two times a day (BID) | ORAL | 0 refills | Status: DC

## 2023-08-23 ENCOUNTER — Encounter: Payer: Self-pay | Admitting: Physical Medicine and Rehabilitation

## 2023-08-23 ENCOUNTER — Ambulatory Visit (INDEPENDENT_AMBULATORY_CARE_PROVIDER_SITE_OTHER): Admitting: Physical Medicine and Rehabilitation

## 2023-08-23 DIAGNOSIS — G8929 Other chronic pain: Secondary | ICD-10-CM

## 2023-08-23 DIAGNOSIS — M79671 Pain in right foot: Secondary | ICD-10-CM | POA: Diagnosis not present

## 2023-08-23 DIAGNOSIS — M545 Low back pain, unspecified: Secondary | ICD-10-CM | POA: Diagnosis not present

## 2023-08-23 DIAGNOSIS — R202 Paresthesia of skin: Secondary | ICD-10-CM | POA: Diagnosis not present

## 2023-08-23 DIAGNOSIS — M79672 Pain in left foot: Secondary | ICD-10-CM

## 2023-08-23 NOTE — Progress Notes (Signed)
 Pain Scale   Average Pain 0 Patient advising he has Foot numbness bilaterally when he get up in the morning and at times it happens when he stands for a while.        +Driver, -BT, -Dye Allergies.

## 2023-08-23 NOTE — Progress Notes (Signed)
 Richard Ryan - 23 y.o. male MRN [id number]  Date of birth: 06/04/2000  Office Visit Note: Visit Date: 08/23/2023 PCP: Richard Frock, PA-C Referred by: Richard Frock, PA-C  Subjective: Chief Complaint  Patient presents with   Left Foot - Numbness   Right Foot - Numbness   HPI: Richard Ryan is a 23 y.o. male who comes in today for evaluation of multiple issues. He reports chronic intermittent bilateral lower back pain and chronic numbness/tingling to both feet. Symptoms ongoing for 3 plus years. Lower back pain occurs infrequently, mostly with bending. Bilateral foot numbness is positional. Symptoms occur after episodes of walking, followed by sitting and then standing. He also reports intermittent pain to plantar aspects of both feet. Numbness occurs in different aspects of his feet, mostly heel regions and toes. States he was at concert on Wednesday evening this week when left 3rd and 4th toes became very numb. He describes numbness as feeling like both feet are asleep. He has tried home exercise regimen, insoles for shoes and Ibuprofen  without significant relief of pain. The lower back pain is not a big pain generator for him, however paresthesias/pain to bilateral feet does bother him daily. Recent lumbar radiographs show normal anatomical alignment, preserved disc heights, no spondylolisthesis. There is mild degenerative changes noted at L5-S1. Patient denies focal weakness. No recent trauma or falls.      Review of Systems  Musculoskeletal:  Positive for back pain and joint pain.  Neurological:  Positive for tingling. Negative for focal weakness and weakness.  All other systems reviewed and are negative.  Otherwise per HPI.  Assessment & Plan: Visit Diagnoses:    ICD-10-CM   1. Chronic bilateral low back pain without sciatica  M54.50 Ambulatory referral to Podiatry   G89.29     2. Bilateral foot pain  M79.671 Ambulatory referral to Podiatry   M79.672     3. Paresthesia  of skin  R20.2 Ambulatory referral to Podiatry       Plan: Findings:  1. Chronic bilateral lower back pain. No radicular symptoms down the legs. Lower back pain doesn't seem to be bothering him at this time. He continues with home exercise regimen, rest and use of medications. Bilateral lower back pain occurs with bending and is not causing functional difficulty at this time. Recent lumbar x-rays look fairly normal for his age, there is early mild degenerative change at L5-S1. From a structural standpoint I do not think the spine is causing his symptoms. We would consider obtaining lumbar MRI imaging, however at this point I do not think advanced imaging is warranted. Should his symptoms change or declare as more radicular in nature would proceed with MRI imaging.   2. Chronic bilateral foot paresthesias and pain. He continues to experience numbness and tingling to bilateral feet, also pain to both plantar aspects of feet. I do think these symptoms could be more of an intrinsic foot issue, could be structural vs nerve related. Can't completely exclude Morton's Neuroma, however not typically bilateral. We discussed treatment options today including referral to podiatry vs neurology. I elected to place referral to [facility] for further evaluation of his symptoms. No red flag symptoms noted upon exam today.       Meds & Orders: No orders of the defined types were placed in this encounter.   Orders Placed This Encounter  Procedures   Ambulatory referral to Podiatry    Follow-up: Return for [facility].  Procedures: No procedures performed      Clinical History: No specialty comments available.   He reports that he has never smoked. He has been exposed to tobacco smoke. He has never used smokeless tobacco.  Recent Labs    08/09/23 1459  HGBA1C 5.7    Objective:  VS:  HT:    WT:   BMI:     BP:   HR: bpm  TEMP: ( )  RESP:  Physical Exam Vitals and nursing  note reviewed.  HENT:     Head: Normocephalic and atraumatic.     Right Ear: External ear normal.     Left Ear: External ear normal.     Nose: Nose normal.     Mouth/Throat:     Mouth: Mucous membranes are moist.  Eyes:     Extraocular Movements: Extraocular movements intact.  Cardiovascular:     Rate and Rhythm: Normal rate.     Pulses: Normal pulses.  Pulmonary:     Effort: Pulmonary effort is normal.  Abdominal:     General: Abdomen is flat. There is no distension.  Musculoskeletal:        General: Tenderness present.     Cervical back: Normal range of motion.     Comments: Patient rises from seated position to standing without difficulty. Good lumbar range of motion. No pain noted with facet loading. 5/5 strength noted with bilateral hip flexion, knee flexion/extension, ankle dorsiflexion/plantarflexion and EHL. No clonus noted bilaterally. No pain upon palpation of greater trochanters. No pain with internal/external rotation of bilateral hips. Sensation intact bilaterally. Negative slump test bilaterally. Ambulates without aid, gait steady.   DP and PT pulses strong bilaterally.     Skin:    General: Skin is warm and dry.     Capillary Refill: Capillary refill takes less than 2 seconds.  Neurological:     General: No focal deficit present.     Mental Status: He is alert and oriented to person, place, and time.  Psychiatric:        Mood and Affect: Mood normal.        Behavior: Behavior normal.     Ortho Exam  Imaging: No results found.  Past Medical/Family/Surgical/Social History: Medications & Allergies reviewed per EMR, new medications updated. Patient Active Problem List   Diagnosis Date Noted   Controlled substance agreement signed 08/09/2023   Numbness of foot 08/09/2023   Seizure-like activity (HCC) 06/26/2014   Acne comedone 06/12/2012   Attention deficit hyperactivity disorder (ADHD) 11/15/2006   Past Medical History:  Diagnosis Date   Seizures (HCC)  08/18/2014   Family History  Problem Relation Age of Onset   Arthritis Mother    Asthma Mother    COPD Mother    Alcohol abuse Father    Drug abuse Father    Intellectual disability Sister    Learning disabilities Sister    Obesity Sister    ADD / ADHD Brother    Intellectual disability Brother    Learning disabilities Brother    Heart attack Maternal Grandfather        Died at 1   Heart attack Paternal Grandfather        Died at 35   ADD / ADHD Brother    Past Surgical History:  Procedure Laterality Date   CIRCUMCISION  2001   Social History   Occupational History   Not on file  Tobacco Use   Smoking status: Never    Passive exposure: Yes  Smokeless tobacco: Never   Tobacco comments:    Mom smokes electronic cigaretts only  Vaping Use   Vaping status: Former  Substance and Sexual Activity   Alcohol use: Not Currently    Alcohol/week: 1.0 standard drink of alcohol    Types: 1 Standard drinks or equivalent per week   Drug use: No   Sexual activity: Yes    Birth control/protection: Condom, Pill

## 2023-09-03 ENCOUNTER — Ambulatory Visit: Admitting: Podiatry

## 2023-09-17 ENCOUNTER — Encounter: Payer: Self-pay | Admitting: Physician Assistant

## 2023-09-17 ENCOUNTER — Ambulatory Visit (INDEPENDENT_AMBULATORY_CARE_PROVIDER_SITE_OTHER): Admitting: Physician Assistant

## 2023-09-17 DIAGNOSIS — F900 Attention-deficit hyperactivity disorder, predominantly inattentive type: Secondary | ICD-10-CM | POA: Diagnosis not present

## 2023-09-17 MED ORDER — AMPHETAMINE-DEXTROAMPHETAMINE 10 MG PO TABS
10.0000 mg | ORAL_TABLET | Freq: Two times a day (BID) | ORAL | 0 refills | Status: DC
Start: 2023-09-17 — End: 2023-10-09

## 2023-09-17 NOTE — Progress Notes (Signed)
      Established patient visit   Patient: Richard Ryan   DOB: May 09, 2000   23 y.o. Male  MRN: [id number] Visit Date: 09/17/2023  Today's healthcare provider: Trenton Frock, PA-C   Cc. Adhd f/u  Subjective    Pt presents for ADHD follow up . Last month we started adderal 5 mg BID.   Pt reports the 5 mg tends to wear off mid morning and doesn't carry him through the day even with the second dose. He tried a few days of 10 mg Am 5 mg PM, and 10 mg AM 10 mg PM and found it helped with focus much more.  Denies issues with insomnia.   Medications: Outpatient Medications Prior to Visit  Medication Sig   [DISCONTINUED] amphetamine -dextroamphetamine  (ADDERALL) 5 MG tablet Take 1 tablet (5 mg total) by mouth 2 (two) times daily with a meal.   No facility-administered medications prior to visit.    Review of Systems  Constitutional:  Negative for fatigue and fever.  Respiratory:  Negative for cough and shortness of breath.   Cardiovascular:  Negative for chest pain, palpitations and leg swelling.  Neurological:  Negative for dizziness and headaches.  Psychiatric/Behavioral:  Positive for decreased concentration.        Objective    BP 120/60 (BP Location: Left Arm, Patient Position: Sitting, Cuff Size: Large)   Pulse 83   Temp 98.3 F (36.8 C) (Oral)   Resp 18   Ht 5\' 9"  (1.753 m)   Wt 265 lb 3.2 oz (120.3 kg)   SpO2 97%   BMI 39.16 kg/m    Physical Exam Vitals reviewed.  Constitutional:      Appearance: He is not ill-appearing.  HENT:     Head: Normocephalic.  Eyes:     Conjunctiva/sclera: Conjunctivae normal.  Cardiovascular:     Rate and Rhythm: Normal rate.  Pulmonary:     Effort: Pulmonary effort is normal. No respiratory distress.  Neurological:     Mental Status: He is alert and oriented to person, place, and time.  Psychiatric:        Mood and Affect: Mood normal.        Behavior: Behavior normal.      No results found for any visits on  09/17/23.  Assessment & Plan    Attention deficit hyperactivity disorder (ADHD), predominantly inattentive type Assessment & Plan: Increase adderall IR to 10 mg AM, 10 mg PM. F/u 4 mo or earlier if needed  Orders: -     Amphetamine -Dextroamphetamine ; Take 1 tablet (10 mg total) by mouth 2 (two) times daily.  Dispense: 60 tablet; Refill: 0     Return in about 4 months (around 01/18/2024), or if symptoms worsen or fail to improve, for adhd.       Trenton Frock, PA-C  [facility] at [facility] [phone] (phone) [phone] (fax)  [facility]

## 2023-09-17 NOTE — Assessment & Plan Note (Signed)
 Increase adderall IR to 10 mg AM, 10 mg PM. F/u 4 mo or earlier if needed

## 2023-10-09 ENCOUNTER — Encounter: Payer: Self-pay | Admitting: Physician Assistant

## 2023-10-09 ENCOUNTER — Other Ambulatory Visit: Payer: Self-pay | Admitting: Physician Assistant

## 2023-10-09 DIAGNOSIS — F900 Attention-deficit hyperactivity disorder, predominantly inattentive type: Secondary | ICD-10-CM

## 2023-10-09 MED ORDER — AMPHETAMINE-DEXTROAMPHETAMINE 10 MG PO TABS: 10.0000 mg | ORAL_TABLET | Freq: Two times a day (BID) | ORAL | 0 refills | Status: DC

## 2023-11-18 ENCOUNTER — Encounter: Payer: Self-pay | Admitting: Physician Assistant

## 2023-11-18 DIAGNOSIS — F900 Attention-deficit hyperactivity disorder, predominantly inattentive type: Secondary | ICD-10-CM

## 2023-11-19 NOTE — Telephone Encounter (Signed)
 Requesting: Adderall 10mg   Contract: 08/09/23 UDS: 08/09/23 Last Visit: 10/09/23 Next Visit: None Last Refill: 10/09/23 #60 and 0RF   Please Advise

## 2023-11-20 ENCOUNTER — Other Ambulatory Visit: Payer: Self-pay | Admitting: Physician Assistant

## 2023-11-20 DIAGNOSIS — F900 Attention-deficit hyperactivity disorder, predominantly inattentive type: Secondary | ICD-10-CM

## 2023-11-20 MED ORDER — AMPHETAMINE-DEXTROAMPHETAMINE 10 MG PO TABS
10.0000 mg | ORAL_TABLET | Freq: Two times a day (BID) | ORAL | 0 refills | Status: DC
Start: 2023-11-20 — End: 2023-12-24

## 2023-11-20 NOTE — Telephone Encounter (Signed)
 sent

## 2023-12-17 ENCOUNTER — Encounter: Payer: Self-pay | Admitting: Physician Assistant

## 2023-12-21 ENCOUNTER — Encounter: Payer: Self-pay | Admitting: Physician Assistant

## 2023-12-24 ENCOUNTER — Other Ambulatory Visit: Payer: Self-pay | Admitting: Physician Assistant

## 2023-12-24 DIAGNOSIS — F900 Attention-deficit hyperactivity disorder, predominantly inattentive type: Secondary | ICD-10-CM

## 2023-12-24 MED ORDER — AMPHETAMINE-DEXTROAMPHETAMINE 10 MG PO TABS: 10.0000 mg | ORAL_TABLET | Freq: Two times a day (BID) | ORAL | 0 refills | Status: DC

## 2024-01-29 ENCOUNTER — Ambulatory Visit: Admitting: Family Medicine

## 2024-01-29 ENCOUNTER — Telehealth: Payer: Self-pay

## 2024-01-29 ENCOUNTER — Encounter: Payer: Self-pay | Admitting: Family Medicine

## 2024-01-29 DIAGNOSIS — F900 Attention-deficit hyperactivity disorder, predominantly inattentive type: Secondary | ICD-10-CM

## 2024-01-29 MED ORDER — AMPHETAMINE-DEXTROAMPHETAMINE 10 MG PO TABS: 10.0000 mg | ORAL_TABLET | Freq: Two times a day (BID) | ORAL | 0 refills | Status: AC

## 2024-01-29 MED ORDER — AMPHETAMINE-DEXTROAMPHETAMINE 10 MG PO TABS: 10.0000 mg | ORAL_TABLET | Freq: Two times a day (BID) | ORAL | 0 refills | Status: DC

## 2024-01-29 NOTE — Progress Notes (Signed)
 New Patient Office Visit  Subjective    Patient ID: Richard Ryan, male    DOB: [birth year]  Age: 22 y.o. MRN: [id number]  CC:  Chief Complaint  Patient presents with   Establish Care    Refill on Adderall    HPI Richard Ryan presents to establish care with this practice. He is new to me.  ADHD: Taking Adderall 10 mg BID Symptoms are well controlled. Sleep and appetite are normal.  Last filled on 12/24/23 # 60 per PDMP review.  Last urine drug screen on 08/09/23.  Will sign contract today.     Outpatient Encounter Medications as of 01/29/2024  Medication Sig   [DISCONTINUED] amphetamine -dextroamphetamine  (ADDERALL) 10 MG tablet Take 1 tablet (10 mg total) by mouth 2 (two) times daily.   [DISCONTINUED] amphetamine -dextroamphetamine  (ADDERALL) 10 MG tablet Take 1 tablet (10 mg total) by mouth 2 (two) times daily.   amphetamine -dextroamphetamine  (ADDERALL) 10 MG tablet Take 1 tablet (10 mg total) by mouth 2 (two) times daily.   [START ON 02/28/2024] amphetamine -dextroamphetamine  (ADDERALL) 10 MG tablet Take 1 tablet (10 mg total) by mouth 2 (two) times daily.   [START ON 03/29/2024] amphetamine -dextroamphetamine  (ADDERALL) 10 MG tablet Take 1 tablet (10 mg total) by mouth 2 (two) times daily.   No facility-administered encounter medications on file as of 01/29/2024.    Past Medical History:  Diagnosis Date   ADHD    Seizures (HCC) 08/18/2014    Past Surgical History:  Procedure Laterality Date   CIRCUMCISION  2001    Family History  Problem Relation Age of Onset   Arthritis Mother    Asthma Mother    COPD Mother    Alcohol abuse Father    Drug abuse Father    Intellectual disability Sister    Learning disabilities Sister    Obesity Sister    ADD / ADHD Brother    Intellectual disability Brother    Learning disabilities Brother    Heart attack Maternal Grandfather        Died at 79   Heart attack Paternal Grandfather        Died at 34   ADD / ADHD Brother      Social History   Socioeconomic History   Marital status: Single    Spouse name: Not on file   Number of children: Not on file   Years of education: Not on file   Highest education level: 12th grade  Occupational History   Not on file  Tobacco Use   Smoking status: Never    Passive exposure: Yes   Smokeless tobacco: Never   Tobacco comments:    Mom smokes electronic cigaretts only  Vaping Use   Vaping status: Former  Substance and Sexual Activity   Alcohol use: Not Currently    Alcohol/week: 1.0 standard drink of alcohol    Types: 1 Standard drinks or equivalent per week    Comment: Rarely   Drug use: No   Sexual activity: Yes    Partners: Female  Other Topics Concern   Not on file  Social History Narrative   Not on file   Social Drivers of Health   Financial Resource Strain: Medium Risk (01/28/2024)   Overall Financial Resource Strain (CARDIA)    Difficulty of Paying Living Expenses: Somewhat hard  Food Insecurity: Food Insecurity Present (01/28/2024)   Hunger Vital Sign    Worried About Running Out of Food in the Last Year: Sometimes true    Ran  Out of Food in the Last Year: Sometimes true  Transportation Needs: No Transportation Needs (01/28/2024)   PRAPARE - Administrator, Civil Service (Medical): No    Lack of Transportation (Non-Medical): No  Physical Activity: Sufficiently Active (01/28/2024)   Exercise Vital Sign    Days of Exercise per Week: 4 days    Minutes of Exercise per Session: 60 min  Stress: No Stress Concern Present (01/28/2024)   [employer] of Occupational Health - Occupational Stress Questionnaire    Feeling of Stress: Only a little  Social Connections: Socially Isolated (01/28/2024)   Social Connection and Isolation Panel    Frequency of Communication with Friends and Family: Twice a week    Frequency of Social Gatherings with Friends and Family: Once a week    Attends Religious Services: Never    Database administrator  or Organizations: No    Attends Engineer, structural: Not on file    Marital Status: Never married  Intimate Partner Violence: Not on file    ROS      Objective    BP 129/81   Pulse 75   Temp 98.6 F (37 C) (Oral)   Ht 5' 9 (1.753 m)   Wt 249 lb (112.9 kg)   SpO2 97%   BMI 36.77 kg/m   Physical Exam Vitals and nursing note reviewed.  Constitutional:      General: He is not in acute distress.    Appearance: Normal appearance.  Cardiovascular:     Rate and Rhythm: Normal rate.  Pulmonary:     Effort: Pulmonary effort is normal.  Skin:    General: Skin is warm and dry.  Neurological:     General: No focal deficit present.     Mental Status: He is alert. Mental status is at baseline.  Psychiatric:        Mood and Affect: Mood normal.        Behavior: Behavior normal.        Thought Content: Thought content normal.        Judgment: Judgment normal.        Assessment & Plan:   Problem List Items Addressed This Visit     Attention deficit hyperactivity disorder (ADHD)   Taking Adderall 10 mg BID PDMP reviewed. No red flags. Symptoms are well controlled. Sleep and appetite are normal. Refills sent. Follow-up in 3 months.       Relevant Medications   amphetamine -dextroamphetamine  (ADDERALL) 10 MG tablet   amphetamine -dextroamphetamine  (ADDERALL) 10 MG tablet (Start on 02/28/2024)   amphetamine -dextroamphetamine  (ADDERALL) 10 MG tablet (Start on 03/29/2024)  Agrees with plan of care discussed.  Questions answered.   Return in about 3 months (around 04/29/2024) for ADHD .   Darice JONELLE Brownie, FNP

## 2024-01-29 NOTE — Assessment & Plan Note (Addendum)
 Taking Adderall 10 mg BID PDMP reviewed. No red flags. Symptoms are well controlled. Sleep and appetite are normal. Refills sent. Follow-up in 3 months.

## 2024-01-29 NOTE — Telephone Encounter (Signed)
 Please Advise    Copied from CRM [id number]. Topic: Appointments - Transfer of Care >> Jan 28, 2024  4:49 PM Mercer PEDLAR wrote: Pt is requesting to transfer FROM: Manuelita Flatness PA-C Pt is requesting to transfer TO: Darice Brownie FNP Reason for requested transfer: Current provider leaving practice.  It is the responsibility of the team the patient would like to transfer to (Dr. Darice Brownie FNP) to reach out to the patient if for any reason this transfer is not acceptable.

## 2024-03-03 ENCOUNTER — Encounter: Payer: Self-pay | Admitting: Radiology

## 2024-03-10 NOTE — Telephone Encounter (Signed)
 Patient was seen 01/29/2024 by Darice Brownie, NP for Transfer of Care.

## 2024-04-29 ENCOUNTER — Encounter: Payer: Self-pay | Admitting: Family Medicine

## 2024-04-29 ENCOUNTER — Ambulatory Visit (INDEPENDENT_AMBULATORY_CARE_PROVIDER_SITE_OTHER): Admitting: Family Medicine

## 2024-04-29 VITALS — BP 116/78 | HR 87 | Ht 69.0 in | Wt 251.0 lb

## 2024-04-29 DIAGNOSIS — F900 Attention-deficit hyperactivity disorder, predominantly inattentive type: Secondary | ICD-10-CM | POA: Diagnosis not present

## 2024-04-29 DIAGNOSIS — Z23 Encounter for immunization: Secondary | ICD-10-CM | POA: Diagnosis not present

## 2024-04-29 MED ORDER — AMPHETAMINE-DEXTROAMPHETAMINE 10 MG PO TABS: 10.0000 mg | ORAL_TABLET | Freq: Two times a day (BID) | ORAL | 0 refills | Status: AC

## 2024-04-29 NOTE — Assessment & Plan Note (Signed)
 Tolerated flu vaccine well today.

## 2024-04-29 NOTE — Assessment & Plan Note (Signed)
 Taking adderall 10 mg BID Symptoms are well controlled. Sleep affected by change in work shift. Appetite is normal.  PDMP reviewed: no red flags.  04/11/24: # 60 adderall 10 mg  Refills sent. Follow-up in 4 months with new PCP.

## 2024-04-29 NOTE — Progress Notes (Signed)
 "  Established Patient Office Visit  Subjective   Patient ID: Richard Ryan, male    DOB: [birth year]  Age: 23 y.o. MRN: [id number]  Chief Complaint  Patient presents with   Medical Management of Chronic Issues    HPI  ADHD: adderall 10 mg BID Symptoms are well controlled. Sleep affected by change in work shift. Appetite is normal.  PDMP reviewed: no red flags.  04/11/24: # 60 adderall 10 mg   Flu vaccine today.     ROS    Objective:     BP 116/78   Pulse 87   Ht 5' 9 (1.753 m)   Wt 251 lb (113.9 kg)   SpO2 99%   BMI 37.07 kg/m    Physical Exam Vitals and nursing note reviewed.  Constitutional:      General: He is not in acute distress.    Appearance: Normal appearance.  Cardiovascular:     Rate and Rhythm: Normal rate.  Pulmonary:     Effort: Pulmonary effort is normal.  Skin:    General: Skin is warm and dry.  Neurological:     General: No focal deficit present.     Mental Status: He is alert. Mental status is at baseline.  Psychiatric:        Mood and Affect: Mood normal.        Behavior: Behavior normal.        Thought Content: Thought content normal.        Judgment: Judgment normal.       04/29/2024    3:11 PM 01/29/2024    3:10 PM 09/17/2023    2:13 PM 08/09/2023    3:14 PM 08/01/2017    3:49 PM  Depression screen PHQ 2/9  Decreased Interest 2 2 1 2  0  Down, Depressed, Hopeless 0 0 0 1 0  PHQ - 2 Score 2 2 1 3  0  Altered sleeping 1 0 0    Tired, decreased energy 2 2 0    Change in appetite 0 0 1    Feeling bad or failure about yourself  0 0 0    Trouble concentrating 1 3 1     Moving slowly or fidgety/restless 2 2 0    Suicidal thoughts 0 0 0    PHQ-9 Score 8 9  3      Difficult doing work/chores Somewhat difficult Very difficult Somewhat difficult       Data saved with a previous flowsheet row definition      04/29/2024    3:11 PM 09/17/2023    2:14 PM  GAD 7 : Generalized Anxiety Score  Nervous, Anxious, on Edge 0 0   Control/stop worrying 2 0  Worry too much - different things 3 0  Trouble relaxing 2 0  Restless 0 0  Easily annoyed or irritable 2 1  Afraid - awful might happen 0 0  Total GAD 7 Score 9 1  Anxiety Difficulty Very difficult Somewhat difficult      No results found for any visits on 04/29/24.    The ASCVD Risk score (Arnett DK, et al., 2019) failed to calculate for the following reasons:   The 2019 ASCVD risk score is only valid for ages 32 to 80   * - Cholesterol units were assumed    Assessment & Plan:   Problem List Items Addressed This Visit     Attention deficit hyperactivity disorder (ADHD)   Taking adderall 10 mg BID Symptoms are well controlled. Sleep  affected by change in work shift. Appetite is normal.  PDMP reviewed: no red flags.  04/11/24: # 60 adderall 10 mg  Refills sent. Follow-up in 4 months with new PCP.        Relevant Medications   amphetamine -dextroamphetamine  (ADDERALL) 10 MG tablet (Start on 05/11/2024)   amphetamine -dextroamphetamine  (ADDERALL) 10 MG tablet (Start on 06/10/2024)   amphetamine -dextroamphetamine  (ADDERALL) 10 MG tablet (Start on 07/10/2024)   amphetamine -dextroamphetamine  (ADDERALL) 10 MG tablet (Start on 08/09/2024)   Encounter for administration of vaccine - Primary   Tolerated flu vaccine well today.       Relevant Orders   Flu vaccine trivalent PF, 6mos and older(Flulaval,Afluria,Fluarix,Fluzone) (Completed)  Agrees with plan of care discussed.  Questions answered.   Return in about 4 months (around 08/28/2024) for ADHD .    Darice JONELLE Brownie, FNP  "
